# Patient Record
Sex: Male | Born: 2005 | Race: White | Hispanic: Yes | Marital: Single | Smoking: Never smoker
Health system: Southern US, Community
[De-identification: ages and names within clinical notes are randomized; demographics above are authoritative.]

## PROBLEM LIST (undated history)

## (undated) DIAGNOSIS — E559 Vitamin D deficiency, unspecified: Secondary | ICD-10-CM

## (undated) DIAGNOSIS — R4589 Other symptoms and signs involving emotional state: Secondary | ICD-10-CM

## (undated) DIAGNOSIS — Z789 Other specified health status: Secondary | ICD-10-CM

## (undated) DIAGNOSIS — F23 Brief psychotic disorder: Secondary | ICD-10-CM

---
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
## 2024-01-28 ENCOUNTER — Other Ambulatory Visit: Payer: Self-pay

## 2024-01-28 ENCOUNTER — Ambulatory Visit (HOSPITAL_COMMUNITY)
Admission: EM | Admit: 2024-01-28 | Discharge: 2024-01-28 | Disposition: A | Discharge status: Other Acute Inpatient Hospital | Attending: Psychiatry | Admitting: Psychiatry

## 2024-01-28 ENCOUNTER — Inpatient Hospital Stay (HOSPITAL_COMMUNITY)
Admission: AD | Admit: 2024-01-28 | Discharge: 2024-02-01 | DRG: 885 | Disposition: A | Discharge status: Home / Self Care | Source: Intra-hospital | Attending: Psychiatry | Admitting: Psychiatry

## 2024-01-28 ENCOUNTER — Encounter (HOSPITAL_COMMUNITY): Payer: Self-pay | Admitting: Behavioral Health

## 2024-01-28 DIAGNOSIS — F23 Brief psychotic disorder: Secondary | ICD-10-CM | POA: Diagnosis present

## 2024-01-28 DIAGNOSIS — R4689 Other symptoms and signs involving appearance and behavior: Secondary | ICD-10-CM | POA: Insufficient documentation

## 2024-01-28 DIAGNOSIS — R456 Violent behavior: Secondary | ICD-10-CM | POA: Diagnosis present

## 2024-01-28 DIAGNOSIS — R45851 Suicidal ideations: Secondary | ICD-10-CM | POA: Diagnosis present

## 2024-01-28 DIAGNOSIS — Z046 Encounter for general psychiatric examination, requested by authority: Secondary | ICD-10-CM

## 2024-01-28 DIAGNOSIS — Z79899 Other long term (current) drug therapy: Secondary | ICD-10-CM | POA: Diagnosis not present

## 2024-01-28 DIAGNOSIS — F329 Major depressive disorder, single episode, unspecified: Secondary | ICD-10-CM

## 2024-01-28 DIAGNOSIS — F10929 Alcohol use, unspecified with intoxication, unspecified: Secondary | ICD-10-CM | POA: Diagnosis present

## 2024-01-28 DIAGNOSIS — F10129 Alcohol abuse with intoxication, unspecified: Secondary | ICD-10-CM | POA: Diagnosis present

## 2024-01-28 DIAGNOSIS — R4589 Other symptoms and signs involving emotional state: Secondary | ICD-10-CM | POA: Diagnosis present

## 2024-01-28 DIAGNOSIS — F1020 Alcohol dependence, uncomplicated: Secondary | ICD-10-CM

## 2024-01-28 HISTORY — DX: Brief psychotic disorder: F23

## 2024-01-28 HISTORY — DX: Vitamin D deficiency, unspecified: E55.9

## 2024-01-28 HISTORY — DX: Other symptoms and signs involving emotional state: R45.89

## 2024-01-28 LAB — CBC WITH DIFFERENTIAL/PLATELET
Abs Immature Granulocytes: 0.04 10*3/uL (ref 0.00–0.07)
Basophils Absolute: 0 10*3/uL (ref 0.0–0.1)
Basophils Relative: 0 %
Eosinophils Absolute: 0 10*3/uL (ref 0.0–0.5)
Eosinophils Relative: 0 %
HCT: 45.8 % (ref 39.0–52.0)
Hemoglobin: 16.1 g/dL (ref 13.0–17.0)
Immature Granulocytes: 0 %
Lymphocytes Relative: 14 %
Lymphs Abs: 1.5 10*3/uL (ref 0.7–4.0)
MCH: 31.9 pg (ref 26.0–34.0)
MCHC: 35.2 g/dL (ref 30.0–36.0)
MCV: 90.7 fL (ref 80.0–100.0)
Monocytes Absolute: 0.4 10*3/uL (ref 0.1–1.0)
Monocytes Relative: 4 %
Neutro Abs: 9 10*3/uL — ABNORMAL HIGH (ref 1.7–7.7)
Neutrophils Relative %: 82 %
Platelets: 269 10*3/uL (ref 150–400)
RBC: 5.05 MIL/uL (ref 4.22–5.81)
RDW: 12.7 % (ref 11.5–15.5)
WBC: 11 10*3/uL — ABNORMAL HIGH (ref 4.0–10.5)
nRBC: 0 % (ref 0.0–0.2)

## 2024-01-28 LAB — POCT URINE DRUG SCREEN - MANUAL ENTRY (I-SCREEN)
POC Amphetamine UR: NOT DETECTED
POC Buprenorphine (BUP): NOT DETECTED
POC Cocaine UR: NOT DETECTED
POC Marijuana UR: NOT DETECTED
POC Methadone UR: NOT DETECTED
POC Methamphetamine UR: NOT DETECTED
POC Morphine: NOT DETECTED
POC Oxazepam (BZO): NOT DETECTED
POC Oxycodone UR: NOT DETECTED
POC Secobarbital (BAR): NOT DETECTED

## 2024-01-28 LAB — COMPREHENSIVE METABOLIC PANEL WITH GFR
ALT: 22 U/L (ref 0–44)
AST: 30 U/L (ref 15–41)
Albumin: 4.7 g/dL (ref 3.5–5.0)
Alkaline Phosphatase: 88 U/L (ref 38–126)
Anion gap: 8 (ref 5–15)
BUN: 15 mg/dL (ref 6–20)
CO2: 27 mmol/L (ref 22–32)
Calcium: 8.9 mg/dL (ref 8.9–10.3)
Chloride: 104 mmol/L (ref 98–111)
Creatinine, Ser: 0.84 mg/dL (ref 0.61–1.24)
GFR, Estimated: >60 mL/min (ref >60)
Glucose, Bld: 98 mg/dL (ref 70–99)
Potassium: 3.8 mmol/L (ref 3.5–5.1)
Sodium: 139 mmol/L (ref 135–145)
Total Bilirubin: 0.6 mg/dL (ref 0.0–1.2)
Total Protein: 8.1 g/dL (ref 6.5–8.1)

## 2024-01-28 LAB — TSH: TSH: 1.345 u[IU]/mL (ref 0.350–4.500)

## 2024-01-28 LAB — ETHANOL: Alcohol, Ethyl (B): 133 mg/dL — ABNORMAL HIGH (ref <10)

## 2024-01-28 MED ORDER — ALUM & MAG HYDROXIDE-SIMETH 200-200-20 MG/5ML PO SUSP: 30.0000 mL | ORAL | Status: DC | PRN

## 2024-01-28 MED ORDER — LORAZEPAM 2 MG/ML IJ SOLN: 2.0000 mg | Freq: Three times a day (TID) | INTRAMUSCULAR | Status: DC | PRN

## 2024-01-28 MED ORDER — OLANZAPINE 10 MG IM SOLR: 10.0000 mg | Freq: Three times a day (TID) | INTRAMUSCULAR | Status: DC | PRN

## 2024-01-28 MED ORDER — NICOTINE POLACRILEX 2 MG MT GUM: 2.0000 mg | CHEWING_GUM | OROMUCOSAL | Status: DC | PRN

## 2024-01-28 MED ORDER — ACETAMINOPHEN 325 MG PO TABS: 650.0000 mg | ORAL_TABLET | Freq: Four times a day (QID) | ORAL | Status: DC | PRN

## 2024-01-28 MED ORDER — HALOPERIDOL LACTATE 5 MG/ML IJ SOLN: 10.0000 mg | Freq: Three times a day (TID) | INTRAMUSCULAR | Status: DC | PRN

## 2024-01-28 MED ORDER — DIPHENHYDRAMINE HCL 25 MG PO CAPS: 50.0000 mg | ORAL_CAPSULE | Freq: Three times a day (TID) | ORAL | Status: DC | PRN

## 2024-01-28 MED ORDER — NICOTINE POLACRILEX 2 MG MT GUM
CHEWING_GUM | OROMUCOSAL | Status: AC
  Administered 2024-01-28: 2 mg via ORAL
  Filled 2024-01-28: qty 1

## 2024-01-28 MED ORDER — HALOPERIDOL 5 MG PO TABS: 5.0000 mg | ORAL_TABLET | Freq: Three times a day (TID) | ORAL | Status: DC | PRN

## 2024-01-28 MED ORDER — DIPHENHYDRAMINE HCL 50 MG/ML IJ SOLN: 50.0000 mg | Freq: Three times a day (TID) | INTRAMUSCULAR | Status: DC | PRN

## 2024-01-28 MED ORDER — TRAZODONE HCL 50 MG PO TABS
50.0000 mg | ORAL_TABLET | Freq: Every evening | ORAL | Status: DC | PRN
  Administered 2024-01-28 – 2024-01-31 (×4): 50 mg via ORAL
  Filled 2024-01-28 (×4): qty 1

## 2024-01-28 MED ORDER — MAGNESIUM HYDROXIDE 400 MG/5ML PO SUSP: 30.0000 mL | Freq: Every day | ORAL | Status: DC | PRN

## 2024-01-28 MED ORDER — OLANZAPINE 10 MG IM SOLR: 5.0000 mg | Freq: Three times a day (TID) | INTRAMUSCULAR | Status: DC | PRN

## 2024-01-28 MED ORDER — HYDROXYZINE HCL 25 MG PO TABS
25.0000 mg | ORAL_TABLET | Freq: Three times a day (TID) | ORAL | Status: DC | PRN
  Administered 2024-01-31: 25 mg via ORAL
  Filled 2024-01-28 (×2): qty 1

## 2024-01-28 MED ORDER — HALOPERIDOL LACTATE 5 MG/ML IJ SOLN: 5.0000 mg | Freq: Three times a day (TID) | INTRAMUSCULAR | Status: DC | PRN

## 2024-01-28 MED ORDER — OLANZAPINE 5 MG PO TBDP: 5.0000 mg | ORAL_TABLET | Freq: Three times a day (TID) | ORAL | Status: DC | PRN
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
## 2024-01-28 NOTE — ED Notes (Signed)
Pt sleeping at this time   Rise and fall of chest noted
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
## 2024-01-28 NOTE — ED Notes (Signed)
 Patient discharged to [facility]. Pt's belongings returned from locker #1. Patient escorted by staff to the back [place] port for transport to [facility] via [employer].
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
## 2024-01-28 NOTE — Tx Team (Signed)
 Initial Treatment Plan 01/28/2024 6:22 PM [name] [id number]    PATIENT STRESSORS: Financial difficulties   Legal issue   Marital or family conflict   Occupational concerns     PATIENT STRENGTHS: Capable of independent living  Communication skills  Physical Health  Supportive family/friends  Work skills    PATIENT IDENTIFIED PROBLEMS: Risk for self harm (Pt was loading a gun prior to d/c)    Alterations in mood (Anger /agitation, mood lability) Pt aggressive towards family members    Substance Use "I don't drink all the time". Pt etoh was 133 on admission.              DISCHARGE CRITERIA:  Improved stabilization in mood, thinking, and/or behavior Verbal commitment to aftercare and medication compliance Withdrawal symptoms are absent or subacute and managed without 24-hour nursing intervention  PRELIMINARY DISCHARGE PLAN: Outpatient therapy Return to previous living arrangement Return to previous work or school arrangements  PATIENT/FAMILY INVOLVEMENT: This treatment plan has been presented to and reviewed with the patient, Cory Wright. The patient have been given the opportunity to ask questions and make suggestions.  Sherryl Manges, RN 01/28/2024, 6:22 PM

## 2024-01-28 NOTE — Progress Notes (Signed)
 Pt has been accepted to [facility] on 01/28/2024 Bed assignment: 500-1 AFTER 4 PM   Pt meets inpatient criteria per: Liborio Nixon NP  Attending Physician will be: Dr. Sarita Bottom, MD   Report can be called to: Adult unit: [phone]  Pt can arrive after 4 PM TODAY   Care Team Notified: [facility] RN, Fonnie Jarvis RN, Denton Ar RN, Liborio Nixon NP  [place] Milton Streicher LCSW-A   01/28/2024 1:23 PM
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
## 2024-01-28 NOTE — ED Notes (Signed)
 Patient alert at this time. Patient admitted to Obs under IVC. Patient reluctant to talk about why he was admitted to [facility]. IVC papers reports patient barricaded himself in a room with a gun. Unable to determine if patient has HI or AVH. However patient does not appear to be responding to external or internal stimuli. Patient oriented to unit, meal and beverage given. Patient denies any physical complaints when asked. No acute distress noted. Support and encouragement provided. Routine safety checks conducted according to facility protocol. Encouraged patient to notify staff if thoughts of harm toward self or others arise. Patient verbalize understanding and agreement. Will continue to monitor for safety.

## 2024-01-28 NOTE — Discharge Instructions (Signed)
 Patient accepted to [facility]

## 2024-01-28 NOTE — Plan of Care (Signed)
  Problem: Safety: Goal: Periods of time without injury will increase Outcome: Progressing   Problem: Self-Concept: Goal: Ability to identify factors that promote anxiety will improve Outcome: Progressing Goal: Ability to modify response to factors that promote anxiety will improve Outcome: Progressing

## 2024-01-28 NOTE — Plan of Care (Signed)
  Problem: Safety: Goal: Periods of time without injury will increase 01/28/2024 1923 by Sherryl Manges, RN Outcome: Progressing  Problem: Self-Concept: Goal: Ability to identify factors that promote anxiety will improve 01/28/2024 1923 by Sherryl Manges, RN Outcome: Progressing Goal: Ability to modify response to factors that promote anxiety will improve 01/28/2024 1923 by Sherryl Manges, RN Outcome: Progressing

## 2024-01-28 NOTE — ED Notes (Addendum)
 Report given to Alexa,RN

## 2024-01-28 NOTE — ED Notes (Signed)
 Lunch offered but pt declined.

## 2024-01-28 NOTE — ED Notes (Signed)
 Pt A&Ox4. Denies SI/HI, AVH. Pt calm and cooperative at this time. Will continue to monitor pt.

## 2024-01-28 NOTE — ED Notes (Signed)
 Call placed to give report, no answer. Will try again.
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
## 2024-01-29 DIAGNOSIS — F23 Brief psychotic disorder: Secondary | ICD-10-CM | POA: Diagnosis present

## 2024-01-29 DIAGNOSIS — R4589 Other symptoms and signs involving emotional state: Secondary | ICD-10-CM | POA: Diagnosis present

## 2024-01-29 DIAGNOSIS — R456 Violent behavior: Secondary | ICD-10-CM | POA: Diagnosis present

## 2024-01-29 DIAGNOSIS — F10929 Alcohol use, unspecified with intoxication, unspecified: Secondary | ICD-10-CM | POA: Diagnosis present

## 2024-01-29 LAB — LIPID PANEL
Cholesterol: 168 mg/dL (ref 0–200)
HDL: 73 mg/dL (ref >40)
LDL Cholesterol: 68 mg/dL (ref 0–99)
Total CHOL/HDL Ratio: 2.3 ratio
Triglycerides: 136 mg/dL (ref <150)
VLDL: 27 mg/dL (ref 0–40)

## 2024-01-29 LAB — HEMOGLOBIN A1C
Hgb A1c MFr Bld: 4.9 % (ref 4.8–5.6)
Mean Plasma Glucose: 93.93 mg/dL

## 2024-01-29 LAB — TSH: TSH: 2.775 u[IU]/mL (ref 0.350–4.500)

## 2024-01-29 LAB — VITAMIN D 25 HYDROXY (VIT D DEFICIENCY, FRACTURES): Vit D, 25-Hydroxy: 8.48 ng/mL — ABNORMAL LOW (ref 30–100)

## 2024-01-29 LAB — FOLATE: Folate: 10.2 ng/mL (ref >5.9)

## 2024-01-29 LAB — VITAMIN B12: Vitamin B-12: 341 pg/mL (ref 180–914)

## 2024-01-29 MED ORDER — NICOTINE 21 MG/24HR TD PT24
21.0000 mg | MEDICATED_PATCH | Freq: Every day | TRANSDERMAL | Status: DC
  Administered 2024-01-29 – 2024-02-01 (×3): 21 mg via TRANSDERMAL
  Filled 2024-01-29 (×7): qty 1

## 2024-01-29 MED ORDER — RISPERIDONE 1 MG PO TBDP
1.0000 mg | ORAL_TABLET | Freq: Two times a day (BID) | ORAL | Status: DC
  Administered 2024-01-29 – 2024-01-30 (×2): 1 mg via ORAL
  Filled 2024-01-29 (×6): qty 1

## 2024-01-29 NOTE — Plan of Care (Signed)
   Problem: Education: Goal: Emotional status will improve Outcome: Not Progressing Goal: Mental status will improve Outcome: Not Progressing
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
## 2024-01-29 NOTE — Group Note (Signed)
 Date:  01/29/2024 Time:  8:30 PM  Group Topic/Focus:  Wrap-Up Group:   The focus of this group is to help patients review their daily goal of treatment and discuss progress on daily workbooks.    Participation Level:  Active  Lita Mains [facility] 01/29/2024, 8:30 PM

## 2024-01-29 NOTE — B[facility] Counselor (Signed)
 Adult Comprehensive Assessment  Patient ID: Cory Wright, male   DOB: 26-May-2006, 18 y.o.   MRN: [id number]  Information Source: Information source: Patient  Current Stressors:  Patient states their primary concerns and needs for treatment are:: patient share that the [employer] brought him here for SI Patient states their goals for this hospitilization and ongoing recovery are:: Patient share that he does not know Educational / Learning stressors: none reported Employment / Job issues: none reported Family Relationships: none reported Surveyor, quantity / Lack of resources (include bankruptcy): not really Housing / Lack of housing: none reported Physical health (include injuries & life threatening diseases): none reported Social relationships: none reported Substance abuse: none reported Bereavement / Loss: none reported  Living/Environment/Situation:  Living Arrangements: Other relatives Living conditions (as described by patient or guardian): Patient share that its good Who else lives in the home?: sister and patient How long has patient lived in current situation?: a couple of months What is atmosphere in current home: Comfortable, Supportive, Loving  Family History:  Marital status: Single Are you sexually active?: Yes What is your sexual orientation?: heterosexual Has your sexual activity been affected by drugs, alcohol, medication, or emotional stress?: none reported Does patient have children?: No  Childhood History:  By whom was/is the patient raised?: Mother Additional childhood history information: patient share they separated a long time ago Description of patient's relationship with caregiver when they were a child: Patient share that it was good Patient's description of current relationship with people who raised him/her: Patient share that it is still good How were you disciplined when you got in trouble as a child/adolescent?: Patient share that he grounded, and  things get taken away Does patient have siblings?: Yes Number of Siblings: 4 Description of patient's current relationship with siblings: Patient share that it is good Did patient suffer any verbal/emotional/physical/sexual abuse as a child?: No Did patient suffer from severe childhood neglect?: No Witnessed domestic violence?: No  Education:  Highest grade of school patient has completed: 12 grade Currently a student?: No Learning disability?: No  Employment/Work Situation:   Employment Situation: Employed Where is Patient Currently Employed?: 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?: No  Financial Resources:   Financial resources: Income from employment, Medicaid Does patient have a representative payee or guardian?: No  Alcohol/Substance Abuse:   What has been your use of drugs/alcohol within the last 12 months?: yes alcohol If attempted suicide, did drugs/alcohol play a role in this?: No Alcohol/Substance Abuse Treatment Hx: Denies past history Has alcohol/substance abuse ever caused legal problems?: No  Social Support System:   Conservation officer, nature Support System: Fair Museum/gallery exhibitions officer System: Patient share that he has his family and friends Type of faith/religion: Patient share that he was catholic How does patient's faith help to cope with current illness?: Patient share that he has faith  Leisure/Recreation:   Do You Have Hobbies?: Yes Leisure and Hobbies: Patient share he plays gamies  Strengths/Needs:   What is the patient's perception of their strengths?: Patient share that he is a good listener Patient states they can use these personal strengths during their treatment to  contribute to their recovery: Patient share that listening will help understand  the lesson Patient states these barriers may affect/interfere with their treatment: Patient does not know Patient states these barriers may affect their return to the community: Patient share that will affect when he knows more about what had happen  Discharge Plan:   Currently receiving community mental health services: No Patient states concerns and preferences for aftercare planning are: Patient share that he does not care for out patient therapy Patient states they will know when they are safe and ready for discharge when: Patient share that he does not know Does patient have access to transportation?: Yes Does patient have financial barriers related to discharge medications?: No Patient description of barriers related to discharge medications: none reported  Summary/Recommendations:   Summary and Recommendations (to be completed by the evaluator): Cory Wright is a 18 year old Hispanic American Male. The patient is alert and oriented X4. The patient was IVC to [facility] for a brief psychotic episode. At the current time the client denies SI, HI and AVH. The patient has 4 sibling and is currently staying with his older sister. The client sister reported that he locked himself in the bathroom and threaten to kill himself. The client share that he does not know why he was put in [facility]. The client stated that his sister put him here to teach him a lesson. The patient has no history of psychiatric disorder or has seek mental health. The patient share that he will never do it again. 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 The patient share that he needs to be discharge because he has to work. Patient will benefit from crisis stabilization, medication evaluation, group therapy and psychoeducation, in addition to case management for discharge planning. At discharge it is recommended that Patient  adhere to the established discharge plan and continue in treatment.  Cory Wright. 01/29/2024
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
## 2024-01-30 ENCOUNTER — Encounter (HOSPITAL_COMMUNITY): Payer: Self-pay | Admitting: Behavioral Health

## 2024-01-30 DIAGNOSIS — F23 Brief psychotic disorder: Principal | ICD-10-CM

## 2024-01-30 LAB — RPR: RPR Ser Ql: NONREACTIVE

## 2024-01-30 MED ORDER — RISPERIDONE 2 MG PO TBDP
2.0000 mg | ORAL_TABLET | Freq: Two times a day (BID) | ORAL | Status: DC
  Filled 2024-01-30 (×2): qty 1

## 2024-01-30 MED ORDER — RISPERIDONE 1 MG PO TBDP: 1.0000 mg | ORAL_TABLET | Freq: Every day | ORAL | Status: DC

## 2024-01-30 MED ORDER — VITAMIN D3 25 MCG PO TABS
2000.0000 [IU] | ORAL_TABLET | Freq: Two times a day (BID) | ORAL | Status: DC
  Administered 2024-01-31 – 2024-02-01 (×3): 2000 [IU] via ORAL
  Filled 2024-01-30 (×5): qty 2

## 2024-01-30 MED ORDER — RISPERIDONE 1 MG PO TBDP
1.0000 mg | ORAL_TABLET | Freq: Two times a day (BID) | ORAL | Status: DC
  Administered 2024-01-30 – 2024-01-31 (×2): 1 mg via ORAL
  Filled 2024-01-30 (×7): qty 1

## 2024-01-30 MED ORDER — VITAMIN D (ERGOCALCIFEROL) 1.25 MG (50000 UNIT) PO CAPS
50000.0000 [IU] | ORAL_CAPSULE | ORAL | Status: DC
  Administered 2024-01-30: 50000 [IU] via ORAL
  Filled 2024-01-30 (×2): qty 1

## 2024-01-30 NOTE — [facility] Group Notes (Signed)
 [facility] Group Notes:  (Nursing/MHT/Case Management/Adjunct)  Date:  01/30/2024  Time:  2000  Type of Therapy:   Wrap up group  Participation Level:  Active  Participation Quality:  Appropriate, Attentive, Sharing, and Supportive  Affect:  Appropriate  Cognitive:  Alert  Insight:  Improving  Engagement in Group:  Engaged  Modes of Intervention:  Clarification, Education, and Socialization  Summary of Progress/Problems: Positive thinking and self-care were discussed.   Marcille Buffy 01/30/2024, 9:19 PM

## 2024-01-30 NOTE — Progress Notes (Signed)
   01/29/24 2006  Psych Admission Type (Psych Patients Only)  Admission Status Involuntary  Psychosocial Assessment  Patient Complaints None  Eye Contact Fair  Facial Expression Anxious  Affect Anxious  Speech Logical/coherent  Interaction Forwards little  Motor Activity Restless  Appearance/Hygiene Unremarkable  Behavior Characteristics Appropriate to situation  Mood Anxious  Aggressive Behavior  Effect No apparent injury  Thought Process  Coherency WDL  Delusions None reported or observed  Perception WDL  Hallucination None reported or observed  Judgment WDL  Confusion WDL  Danger to Self  Current suicidal ideation? Denies  Self-Injurious Behavior No self-injurious ideation or behavior indicators observed or expressed   Description of Agreement verbal  Danger to Others  Danger to Others None reported or observed

## 2024-01-30 NOTE — Progress Notes (Signed)
   01/30/24 0800  Psych Admission Type (Psych Patients Only)  Admission Status Involuntary  Psychosocial Assessment  Patient Complaints None  Eye Contact Fair  Facial Expression Anxious  Affect Anxious  Speech Logical/coherent  Interaction Forwards little  Motor Activity Other (Comment) (WNL)  Appearance/Hygiene Unremarkable  Behavior Characteristics Cooperative;Calm  Mood Anxious  Thought Process  Coherency WDL  Content WDL  Delusions None reported or observed  Perception WDL  Hallucination None reported or observed  Judgment WDL  Confusion WDL  Danger to Self  Current suicidal ideation? Denies  Self-Injurious Behavior No self-injurious ideation or behavior indicators observed or expressed   Agreement Not to Harm Self Yes  Description of Agreement verbal  Danger to Others  Danger to Others None reported or observed

## 2024-01-30 NOTE — Plan of Care (Signed)
  Problem: Education: Goal: Emotional status will improve Outcome: Progressing Goal: Verbalization of understanding the information provided will improve Outcome: Progressing   Problem: Activity: Goal: Interest or engagement in activities will improve Outcome: Progressing Goal: Sleeping patterns will improve Outcome: Progressing   Problem: Education: Goal: Knowledge of [place] [facility] Education information/materials will improve Outcome: Progressing   Problem: Health Behavior/Discharge Planning: Goal: Compliance with treatment plan for underlying cause of condition will improve Outcome: Progressing

## 2024-01-30 NOTE — [facility] Suicide Risk Assessment (Signed)
 [facility] INPATIENT:  Family/Significant Other Suicide Prevention Education  Suicide Prevention Education:  Education Completed; Verlon Au Girlfriend [phone]),  has been identified by the patient as the family member/significant other with whom the patient will be residing, and identified as the person(s) who will aid the patient in the event of a mental health crisis (suicidal ideations/suicide attempt).  With written consent from the patient, the family member/significant other has been provided the following suicide prevention education, prior to the and/or following the discharge of the patient.  The suicide prevention education provided includes the following: Suicide risk factors Suicide prevention and interventions National Suicide Hotline telephone number [facility] assessment telephone number [facility] Emergency Assistance 911 [facility] and/or Residential Mobile Crisis Unit telephone number  Request made of family/significant other to: Remove weapons (e.g., guns, rifles, knives), all items previously/currently identified as safety concern.   Remove drugs/medications (over-the-counter, prescriptions, illicit drugs), all items previously/currently identified as a safety concern.  The family member/significant other verbalizes understanding of the suicide prevention education information provided.  The family member/significant other agrees to remove the items of safety concern listed above.  Ira Busbin O Elice Crigger 01/30/2024, 2:29 PM
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
## 2024-01-30 NOTE — Progress Notes (Signed)
   01/30/24 2152  Psych Admission Type (Psych Patients Only)  Admission Status Involuntary  Psychosocial Assessment  Patient Complaints None  Eye Contact Fair  Facial Expression Anxious  Affect Appropriate to circumstance  Speech Logical/coherent  Interaction Guarded;Forwards little  Motor Activity Other (Comment) (WDL)  Appearance/Hygiene Unremarkable  Behavior Characteristics Appropriate to situation  Mood Anxious;Pleasant  Thought Process  Coherency WDL  Content WDL  Delusions None reported or observed  Perception WDL  Hallucination None reported or observed  Judgment WDL  Confusion None  Danger to Self  Current suicidal ideation? Denies  Self-Injurious Behavior No self-injurious ideation or behavior indicators observed or expressed   Agreement Not to Harm Self Yes  Description of Agreement verbal  Danger to Others  Danger to Others None reported or observed

## 2024-01-31 ENCOUNTER — Encounter (HOSPITAL_COMMUNITY): Payer: Self-pay

## 2024-01-31 DIAGNOSIS — F23 Brief psychotic disorder: Secondary | ICD-10-CM | POA: Diagnosis not present

## 2024-01-31 MED ORDER — RISPERIDONE 0.25 MG PO TABS
0.2500 mg | ORAL_TABLET | Freq: Every day | ORAL | Status: DC
  Administered 2024-01-31: 0.25 mg via ORAL
  Filled 2024-01-31 (×3): qty 1

## 2024-01-31 NOTE — Plan of Care (Signed)
   Problem: Education: Goal: Mental status will improve Outcome: Progressing   Problem: Education: Goal: Verbalization of understanding the information provided will improve Outcome: Progressing

## 2024-01-31 NOTE — Group Note (Signed)
 Recreation Therapy Group Note   Group Topic:Problem Solving  Group Date: 01/31/2024 Start Time: 0935 End Time: 1000 Facilitators: Altie Savard-McCall, LRT,CTRS Location: [address] Dayroom   Group Topic: Communication, Team Building, Problem Solving   Goal Area(s) Addresses:  Patient will effectively work with peer towards shared goal.  Patient will identify skills used to make activity successful.  Patient will identify how skills used during activity can be used to reach post d/c goals.    Intervention: STEM Activity   Activity: Straw Bridge. In teams of 3-5, patients were given 15 plastic drinking straws and an equal length of masking tape. Using the materials provided, patients were instructed to build a free standing bridge-like structure to suspend an everyday item (ex: puzzle box) off of the floor or table surface. All materials were required to be used by the team in their design. LRT facilitated post-activity discussion reviewing team process. Patients were encouraged to reflect how the skills used in this activity can be generalized to daily life post discharge.    Education: Pharmacist, community, Scientist, physiological, Discharge Planning    Education Outcome: Acknowledges education/In group clarification offered/Needs additional education.   Affect/Mood: Appropriate   Participation Level: Engaged   Participation Quality: Independent   Behavior: Appropriate   Speech/Thought Process: Focused   Insight: Good   Judgement: Good   Modes of Intervention: STEM Activity   Patient Response to Interventions:  Engaged   Education Outcome:  In group clarification offered    Clinical Observations/Individualized Feedback: Pt was quiet but attentive and engaged. Pt worked well with peer in completing the straw bridge to successfully hold the puzzle box.      Plan: Continue to engage patient in RT group sessions 2-3x/week.   Maurya Nethery-McCall, LRT,CTRS 01/31/2024 11:57 AM
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
## 2024-01-31 NOTE — Group Note (Signed)
 Date:  01/31/2024 Time:  6:14 PM  Group Topic/Focus:  Building Self Esteem:   The Focus of this group is helping patients become aware of the effects of self-esteem on their lives, the things they and others do that enhance or undermine their self-esteem, seeing the relationship between their level of self-esteem and the choices they make and learning ways to enhance self-esteem. Emotional Education:   The focus of this group is to discuss what feelings/emotions are, and how they are experienced.    Participation Level:  Active  Participation Quality:  Appropriate  Affect:  Appropriate  Cognitive:  Appropriate  Insight: Appropriate  Engagement in Group:  Engaged  Modes of Intervention:  Activity  Additional Comments:   Pt attended and actively participated in the Emotional Wellness group.  Edmund Hilda Ephraim Reichel 01/31/2024, 6:14 PM

## 2024-01-31 NOTE — Group Note (Signed)
 Occupational Therapy Group Note  Group Topic: Sleep Hygiene  Group Date: 01/31/2024 Start Time: 1430 End Time: 1500 Facilitators: Ted Mcalpine, OT   Group Description: Group encouraged increased participation and engagement through topic focused on sleep hygiene. Patients reflected on the quality of sleep they typically receive and identified areas that need improvement. Group was given background information on sleep and sleep hygiene, including common sleep disorders. Group members also received information on how to improve one's sleep and introduced a sleep diary as a tool that can be utilized to track sleep quality over a length of time. Group session ended with patients identifying one or more strategies they could utilize or implement into their sleep routine in order to improve overall sleep quality.        Therapeutic Goal(s):  Identify one or more strategies to improve overall sleep hygiene  Identify one or more areas of sleep that are negatively impacted (sleep too much, too little, etc)     Participation Level: Engaged   Participation Quality: Independent   Behavior: Appropriate   Speech/Thought Process: Relevant   Affect/Mood: Appropriate   Insight: Fair   Judgement: Fair      Modes of Intervention: Education  Patient Response to Interventions:  Attentive   Plan: Continue to engage patient in OT groups 2 - 3x/week.  01/31/2024  Ted Mcalpine, OT  Kerrin Champagne, OT

## 2024-01-31 NOTE — Group Note (Signed)
 Date:  01/31/2024 Time:  9:33 PM  Group Topic/Focus:  Alcoholics Anonymous (AA) Meeting    Participation Level:  Active  Participation Quality:  Appropriate  Affect:  Appropriate  Cognitive:  Appropriate  Insight: Appropriate  Engagement in Group:  Engaged  Modes of Intervention:  Discussion, Socialization, and Support  Additional Comments:  Patient attended AA  Cory Wright 01/31/2024, 9:33 PM

## 2024-01-31 NOTE — Group Note (Signed)
 Date:  01/31/2024 Time:  4:30 PM  Group Topic/Focus:  Goals Group:   The focus of this group is to help patients establish daily goals to achieve during treatment and discuss how the patient can incorporate goal setting into their daily lives to aide in recovery. Orientation:   The focus of this group is to educate the patient on the purpose and policies of crisis stabilization and provide a format to answer questions about their admission.  The group details unit policies and expectations of patients while admitted.    Participation Level:  Active  Participation Quality:  Appropriate and Attentive  Affect:  Appropriate  Cognitive:  Appropriate  Insight: Appropriate  Engagement in Group:  Engaged  Modes of Intervention:  Discussion and Orientation  Additional Comments:   Pt actively participated in the Orientation and Goals group.   Edmund Hilda Adynn Caseres 01/31/2024, 4:30 PM

## 2024-01-31 NOTE — Progress Notes (Signed)
   01/31/24 0531  15 Minute Checks  Location Bedroom  Visual Appearance Calm  Behavior Composed  Sleep (Behavioral Health Patients Only)  Calculate sleep? (Click Yes once per 24 hr at 0600 safety check) Yes  Documented sleep last 24 hours 8

## 2024-01-31 NOTE — [facility] Group Notes (Signed)
 Topic: Spiritual care group on grief and loss facilitated by Chaplain Claudell Kyle, MDiv   Group Goal: Support / Education around grief and loss  Members engage in facilitated group support and psycho-social education.  Group Description: Following introductions and group rules, group members engaged in facilitated group dialog and support around topic of loss, with particular support around experiences of loss in their lives. Group Identified types of loss (relationships / self / things) and identified patterns, circumstances, and changes that precipitate losses. Reflected on thoughts / feelings around loss, normalized grief responses, and recognized variety in grief experience. Group noted Worden's four tasks of grief in discussion.  Group drew on Adlerian / Rogerian, narrative, MI,  Patient Progress: Cory Wright participated in the full group time. Participation was appropriate though mostly passive participation.  [name] L. [name], M.Div [phone]

## 2024-01-31 NOTE — BH IP Treatment Plan (Signed)
 Interdisciplinary Treatment and Diagnostic Plan Update  01/31/2024 Time of Session: 11:20AM Luman Holway MRN: [id number]  Principal Diagnosis: Brief psychotic disorder [facility])  Secondary Diagnoses: Principal Problem:   Brief psychotic disorder (HCC) Active Problems:   Suicidal ideations   Alcohol intoxication (HCC)   Suicidal behavior   Violent behavior   Current Medications:  Current Facility-Administered Medications  Medication Dose Route Frequency Provider Last Rate Last Admin   acetaminophen (TYLENOL) tablet 650 mg  650 mg Oral Q6H PRN White, Patrice L, NP       alum & mag hydroxide-simeth (MAALOX/MYLANTA) 200-200-20 MG/5ML suspension 30 mL  30 mL Oral Q4H PRN White, Patrice L, NP       haloperidol (HALDOL) tablet 5 mg  5 mg Oral TID PRN White, Patrice L, NP       And   diphenhydrAMINE (BENADRYL) capsule 50 mg  50 mg Oral TID PRN White, Patrice L, NP       haloperidol lactate (HALDOL) injection 5 mg  5 mg Intramuscular TID PRN White, Patrice L, NP       And   diphenhydrAMINE (BENADRYL) injection 50 mg  50 mg Intramuscular TID PRN White, Patrice L, NP       And   LORazepam (ATIVAN) injection 2 mg  2 mg Intramuscular TID PRN White, Patrice L, NP       haloperidol lactate (HALDOL) injection 10 mg  10 mg Intramuscular TID PRN White, Patrice L, NP       And   diphenhydrAMINE (BENADRYL) injection 50 mg  50 mg Intramuscular TID PRN White, Patrice L, NP       And   LORazepam (ATIVAN) injection 2 mg  2 mg Intramuscular TID PRN White, Patrice L, NP       hydrOXYzine (ATARAX) tablet 25 mg  25 mg Oral TID PRN White, Patrice L, NP       magnesium hydroxide (MILK OF MAGNESIA) suspension 30 mL  30 mL Oral Daily PRN White, Patrice L, NP       nicotine (NICODERM CQ - dosed in mg/24 hours) patch 21 mg  21 mg Transdermal Daily Attiah, Nadir, MD   21 mg at 01/31/24 8295   nicotine polacrilex (NICORETTE) gum 2 mg  2 mg Oral PRN Bobbitt, Shalon E, NP   2 mg at 01/28/24 2121    risperiDONE (RISPERDAL) tablet 0.25 mg  0.25 mg Oral QHS Izediuno, Vincent A, MD       traZODone (DESYREL) tablet 50 mg  50 mg Oral QHS PRN White, Patrice L, NP   50 mg at 01/30/24 2104   Vitamin D (Ergocalciferol) (DRISDOL) 1.25 MG (50000 UNIT) capsule 50,000 Units  50,000 Units Oral Q7 days Golda Acre, MD   50,000 Units at 01/30/24 1226   vitamin D3 (CHOLECALCIFEROL) tablet 2,000 Units  2,000 Units Oral BID Golda Acre, MD   2,000 Units at 01/31/24 0805   PTA Medications: No medications prior to admission.    Patient Stressors: Neurosurgeon issue   Marital or family conflict   Occupational concerns    Patient Strengths: Capable of independent living  Communication skills  Physical Health  Supportive family/friends  Work skills   Treatment Modalities: Medication Management, Group therapy, Case management,  1 to 1 session with clinician, Psychoeducation, Recreational therapy.   Physician Treatment Plan for Primary Diagnosis: Brief psychotic disorder (HCC) Long Term Goal(s):     Short Term Goals:    Medication Management: Evaluate patient's response,  side effects, and tolerance of medication regimen.  Therapeutic Interventions: 1 to 1 sessions, Unit Group sessions and Medication administration.  Evaluation of Outcomes: Not Progressing  Physician Treatment Plan for Secondary Diagnosis: Principal Problem:   Brief psychotic disorder (HCC) Active Problems:   Suicidal ideations   Alcohol intoxication (HCC)   Suicidal behavior   Violent behavior  Long Term Goal(s):     Short Term Goals:       Medication Management: Evaluate patient's response, side effects, and tolerance of medication regimen.  Therapeutic Interventions: 1 to 1 sessions, Unit Group sessions and Medication administration.  Evaluation of Outcomes: Not Progressing   RN Treatment Plan for Primary Diagnosis: Brief psychotic disorder (HCC) Long Term Goal(s): Knowledge of disease  and therapeutic regimen to maintain health will improve  Short Term Goals: Ability to verbalize frustration and anger appropriately will improve, Ability to demonstrate self-control, Ability to participate in decision making will improve, Ability to disclose and discuss suicidal ideas, and Compliance with prescribed medications will improve  Medication Management: RN will administer medications as ordered by provider, will assess and evaluate patient's response and provide education to patient for prescribed medication. RN will report any adverse and/or side effects to prescribing provider.  Therapeutic Interventions: 1 on 1 counseling sessions, Psychoeducation, Medication administration, Evaluate responses to treatment, Monitor vital signs and CBGs as ordered, Perform/monitor CIWA, COWS, AIMS and Fall Risk screenings as ordered, Perform wound care treatments as ordered.  Evaluation of Outcomes: Not Progressing   LCSW Treatment Plan for Primary Diagnosis: Brief psychotic disorder [facility]) Long Term Goal(s): Safe transition to appropriate next level of care at discharge, Engage patient in therapeutic group addressing interpersonal concerns.  Short Term Goals: Engage patient in aftercare planning with referrals and resources, Increase ability to appropriately verbalize feelings, Increase emotional regulation, Identify triggers associated with mental health/substance abuse issues, and Increase skills for wellness and recovery  Therapeutic Interventions: Assess for all discharge needs, 1 to 1 time with Social worker, Explore available resources and support systems, Assess for adequacy in community support network, Educate family and significant other(s) on suicide prevention, Complete Psychosocial Assessment, Interpersonal group therapy.  Evaluation of Outcomes: Not Progressing   Progress in Treatment: Attending groups: Yes. Participating in groups: Yes. Taking medication as prescribed:  Yes. Toleration medication: Yes. Family/Significant other contact made: Yes, individual(s) contacted:  Pt's girlfriend, however Pt request that CSW contact Pt's mother Suzette Battiest [phone]) Patient understands diagnosis: Yes. Discussing patient identified problems/goals with staff: Yes. Medical problems stabilized or resolved: Yes. Denies suicidal/homicidal ideation: Yes. Issues/concerns per patient self-inventory: No. Other: N/a  New problem(s) identified: No, Describe:  None  New Short Term/Long Term Goal(s): detox, medication management for mood stabilization; elimination of SI thoughts; development of comprehensive mental wellness/sobriety plan  Patient Goals:  "To get better"  Discharge Plan or Barriers: Patient recently admitted. CSW will continue to follow and assess for appropriate referrals and possible discharge planning.   Reason for Continuation of Hospitalization: Medication stabilization Withdrawal symptoms Other; describe Mood stabilization, discharge planning  Estimated Length of Stay: 3-5 DAYS  Last 3 [place] Suicide Severity Risk Score: Flowsheet Row [facility] Most recent reading at 01/28/2024  5:33 PM ED from 01/28/2024 in [facility] Most recent reading at 01/28/2024  4:45 AM ED from 06/06/2022 in [facility] at [facility] Most recent reading at 06/06/2022  2:31 AM  C-SSRS RISK CATEGORY High Risk High Risk No Risk  Last PHQ 2/9 Scores:    01/28/2024    4:35 AM  Depression screen PHQ 2/9  Decreased Interest 0  Down, Depressed, Hopeless 1  PHQ - 2 Score 1  Altered sleeping 0  Tired, decreased energy 0  Change in appetite 0  Feeling bad or failure about yourself  1  Trouble concentrating 0  Moving slowly or fidgety/restless 1  Suicidal thoughts 1  PHQ-9 Score 4  Difficult doing work/chores Somewhat difficult    Scribe  for Treatment Team: Jacinta Shoe, LCSW 01/31/2024 3:25 PM

## 2024-01-31 NOTE — Progress Notes (Signed)
   01/31/24 0805  Psych Admission Type (Psych Patients Only)  Admission Status Involuntary  Psychosocial Assessment  Patient Complaints None  Eye Contact Fair  Facial Expression Animated  Affect Appropriate to circumstance  Speech Logical/coherent  Interaction Guarded  Motor Activity Other (Comment) (wdl)  Appearance/Hygiene Unremarkable  Behavior Characteristics Appropriate to situation  Mood Pleasant  Thought Process  Coherency WDL  Content WDL  Delusions None reported or observed  Perception WDL  Hallucination None reported or observed  Judgment WDL  Confusion None  Danger to Self  Current suicidal ideation? Denies  Agreement Not to Harm Self Yes  Description of Agreement verbal  Danger to Others  Danger to Others None reported or observed

## 2024-02-01 DIAGNOSIS — F23 Brief psychotic disorder: Secondary | ICD-10-CM | POA: Diagnosis not present

## 2024-02-01 LAB — VITAMIN B1: Vitamin B1 (Thiamine): 128.7 nmol/L (ref 66.5–200.0)

## 2024-02-01 MED ORDER — NICOTINE 21 MG/24HR TD PT24: 21.0000 mg | MEDICATED_PATCH | Freq: Every day | TRANSDERMAL | 0 refills | Status: AC

## 2024-02-01 MED ORDER — RISPERIDONE 0.25 MG PO TABS: 0.2500 mg | ORAL_TABLET | Freq: Every day | ORAL | 0 refills | Status: AC

## 2024-02-01 NOTE — Discharge Summary (Signed)
 Physician Discharge Summary Note  Patient:  Cory Wright is an 18 y.o., male MRN:  [id number] DOB:  01-26-2006 Patient phone:  [phone] (home)  Patient address:   [address] [place] [facility] [address],  Total Time spent with patient: 45 minutes  Date of Admission:  01/28/2024 Date of Discharge: 02/01/2024  Reason for Admission:   18 year old Caucasian male, lives with his family, employed.  History of binge use of alcohol.  Presented involuntarily following a rageful episode at home.  Reported to have no violent behavior with his mother's car.  Reported to have withdrawn on his mornings account giving his girlfriend instruction on what to do with the money.  Reported to have barricaded himself at home threatening to shoot himself with a loaded gun.  The police helped de-escalate the situation got him into the hospital. BAL at presentation as 133 mg/dl.  UDS was negative.  Principal Problem: Brief psychotic disorder [facility]) Discharge Diagnoses: Principal Problem:   Brief psychotic disorder (HCC) Active Problems:   Suicidal ideations   Alcohol intoxication (HCC)   Suicidal behavior   Violent behavior   Past Psychiatric History:  No past history of mental illness.  Past Medical History:  Past Medical History:  Diagnosis Date   Brief psychotic disorder (HCC)    Suicidal behavior    Vitamin D deficiency    History reviewed. No pertinent surgical history. Family History: History reviewed. No pertinent family history. Family Psychiatric  History:  No family history of mental illness.  Social History:  Social History   Substance and Sexual Activity  Alcohol Use Yes     Social History   Substance and Sexual Activity  Drug Use Never    Social History   Socioeconomic History   Marital status: Single    Spouse name: Not on file   Number of children: Not on file   Years of education: Not on file   Highest education level: Not on file  Occupational  History   Not on file  Tobacco Use   Smoking status: Never   Smokeless tobacco: Never  Substance and Sexual Activity   Alcohol use: Yes   Drug use: Never   Sexual activity: Not on file  Other Topics Concern   Not on file  Social History Narrative   Not on file   Social Drivers of Health   Financial Resource Strain: Not on file  Food Insecurity: No Food Insecurity (01/28/2024)   Hunger Vital Sign    Worried About Running Out of Food in the Last Year: Never true    Ran Out of Food in the Last Year: Never true  Transportation Needs: No Transportation Needs (01/28/2024)   PRAPARE - Administrator, Civil Service (Medical): No    Lack of Transportation (Non-Medical): No  Physical Activity: Not on file  Stress: Not on file  Social Connections: Not on file    Hospital Course:   Patient was admitted on suicide precautions.  Risperidone was initiated to target psychosis.  He was started on 2 mg twice a day.  Patient was sedated at that dose.  We decreased his dose to 0.25 mg at bedtime which she tolerated better.  He did not exhibit any manic features or psychotic features during his hospital stay.  He did not exhibit any symptoms of alcohol withdrawal.  Sleep-wake cycle was well-regulated.  He participated with unit groups and therapeutic activities.  He did not require any psychiatric or medical emergency measures during his hospital  stay.  At interview today.  Patient feels far more clear minded with lower dose of risperidone.  He tells me that he slept well yesterday.  He is able to think clearly.  He is not experiencing racing thoughts.  He is not endorsing any delusions.  He is not experiencing any hallucinations.  No derealization or depersonalization.  He has remained in communication with his family.  States that they are very supportive.  He is not expressing any rageful thoughts towards himself or towards others.  He is processing ways of staying off alcohol though he  continues to minimize his use.  Social worker obtained collateral from his family.  They have the same opinion as his girlfriend that this was related to alcohol abuse.  No concerns about dangerousness.  Family will pick him up today.  Nursing staff reports that patient slept for 7.75 hours.  No observed response to internal stimuli.  No PRNs required.  He has been exhibiting appropriate behavior on the unit.  Patient and team agrees that he is back to his baseline.  Team agrees with discharge today in company of his family.   Physical Findings: AIMS:  , ,  ,  ,    CIWA:    COWS:     Musculoskeletal: Strength & Muscle Tone: within normal limits Gait & Station: normal Patient leans: N/A   Psychiatric Specialty Exam:  Presentation  General Appearance:  Casually dressed, not in any distress, good relatedness.  No EPS.   Eye Contact: Good.   Speech: Spontaneous.  Normal rate, tone and volume.  Normal prosody of speech.   Mood and Affect  Mood: Euthymic  Affect: Full range and mood congruent. Thought Process  Thought Processes: Linear and goal directed.   Descriptions of Associations: Intact.   Orientation:Full (Time, Place and Person)   Thought Content: No delusional thinking.  No suicidal thoughts.  No homicidal thoughts.  No thoughts of violence.  No negative ruminative thoughts.  No guilty ruminations.  No obsessions.   Hallucinations: No hallucination in any modality.   Sensorium  Memory: Good.  Judgment: Good.   Insight: Better insight into the role of alcohol in recent mental health issues.  Executive Functions  Concentration: Good.   Attention Span: Good.   Recall: Good   Fund of Knowledge: Good   Language: Good     Psychomotor Activity  Normal psychomotor activity   Physical Exam: Physical Exam ROS Blood pressure 134/77, pulse 97, temperature 98 F (36.7 C), temperature source Oral, resp. rate 12, height 5\' 5"  (1.651 m),  weight 60.1 kg, SpO2 100%. Body mass index is 22.07 kg/m.   Social History   Tobacco Use  Smoking Status Never  Smokeless Tobacco Never   Tobacco Cessation:  A prescription for an FDA-approved tobacco cessation medication provided at discharge   Blood Alcohol level:  Lab Results  Component Value Date   ETH 133 (H) 01/28/2024    Metabolic Disorder Labs:  Lab Results  Component Value Date   HGBA1C 4.9 01/29/2024   MPG 93.93 01/29/2024   No results found for: "PROLACTIN" Lab Results  Component Value Date   CHOL 168 01/29/2024   TRIG 136 01/29/2024   HDL 73 01/29/2024   CHOLHDL 2.3 01/29/2024   VLDL 27 01/29/2024   LDLCALC 68 01/29/2024    See Psychiatric Specialty Exam and Suicide Risk Assessment completed by Attending Physician prior to discharge.  Discharge destination:  Home  Is patient on multiple antipsychotic therapies at discharge:  No   Has Patient had three or more failed trials of antipsychotic monotherapy by history:  No  Recommended Plan for Multiple Antipsychotic Therapies: NA  Discharge Instructions     Diet - low sodium heart healthy   Complete by: As directed    Increase activity slowly   Complete by: As directed       Allergies as of 02/01/2024   No Known Allergies      Medication List     TAKE these medications      Indication  nicotine 21 mg/24hr patch Commonly known as: NICODERM CQ - dosed in mg/24 hours Place 1 patch (21 mg total) onto the skin daily. Start taking on: February 02, 2024  Indication: Nicotine Addiction   risperiDONE 0.25 MG tablet Commonly known as: RISPERDAL Take 1 tablet (0.25 mg total) by mouth at bedtime.  Indication: Brief psychotic episode.        Follow-up Information     [facility]. Go on 02/03/2024.   Specialty: Behavioral Health Why: Please go to this provider on 02/03/24 at 7:00 am for an assessment to obtain medication management services. You may also go on Monday  through Friday, arrive by 7:00 am. Contact information: [address] [place] [address] [phone]        [facility] Go on 02/04/2024.   Specialty: Professional Counselor Why: Please go to this provider on 02/04/24 at 9:00 am for an assessment to obtain therapy services. You may also go on Monday through Friday, between 9 am and 1 pm. Contact information: [address] [place] [address] [phone]                 Follow-up recommendations: Patient will stay on medication as recommended.  Patient will abstain from alcohol and all psychoactive substance.  Patient will follow up with appointments.  No dietary or activity restrictions.  Signed: Georgiann Cocker, MD 02/01/2024, 10:28 AM

## 2024-02-01 NOTE — Progress Notes (Signed)
   02/01/24 0900  Psych Admission Type (Psych Patients Only)  Admission Status Involuntary  Psychosocial Assessment  Patient Complaints None  Eye Contact Fair  Facial Expression Anxious  Affect Appropriate to circumstance  Speech Logical/coherent  Interaction Guarded  Motor Activity Other (Comment) (WDL)  Appearance/Hygiene Unremarkable  Behavior Characteristics Cooperative;Anxious  Mood Anxious;Pleasant  Thought Process  Coherency WDL  Content WDL  Delusions None reported or observed  Perception WDL  Hallucination None reported or observed  Judgment WDL  Confusion None  Danger to Self  Current suicidal ideation? Denies  Description of Suicide Plan NO PLAN  Self-Injurious Behavior No self-injurious ideation or behavior indicators observed or expressed   Agreement Not to Harm Self Yes  Description of Agreement VERBAL  Danger to Others  Danger to Others None reported or observed

## 2024-02-01 NOTE — Plan of Care (Signed)
   Problem: Education: Goal: Emotional status will improve Outcome: Progressing Goal: Mental status will improve Outcome: Progressing   Problem: Activity: Goal: Interest or engagement in activities will improve Outcome: Progressing Goal: Sleeping patterns will improve Outcome: Progressing   Problem: Safety: Goal: Periods of time without injury will increase Outcome: Progressing

## 2024-02-01 NOTE — BHH Suicide Risk Assessment (Signed)
 [facility] Discharge Suicide Risk Assessment   Principal Problem: Brief psychotic disorder [facility]) Discharge Diagnoses: Principal Problem:   Brief psychotic disorder (HCC) Active Problems:   Suicidal ideations   Alcohol intoxication (HCC)   Suicidal behavior   Violent behavior   Total Time spent with patient:   Musculoskeletal: Strength & Muscle Tone: within normal limits Gait & Station: normal Patient leans: N/A  Psychiatric Specialty Exam  Presentation  General Appearance:  Casually dressed, not in any distress, good relatedness.  No EPS.   Eye Contact: Good.   Speech: Spontaneous.  Normal rate, tone and volume.  Normal prosody of speech.   Mood and Affect  Mood: Euthymic  Affect: Full range and mood congruent. Thought Process  Thought Processes: Linear and goal directed.   Descriptions of Associations: Intact.   Orientation:Full (Time, Place and Person)   Thought Content: No delusional thinking.  No suicidal thoughts.  No homicidal thoughts.  No thoughts of violence.  No negative ruminative thoughts.  No guilty ruminations.  No obsessions.   Hallucinations: No hallucination in any modality.   Sensorium  Memory: Good.  Judgment: Good.   Insight: Better insight into the role of alcohol in recent mental health issues.  Executive Functions  Concentration: Good.   Attention Span: Good.   Recall: Good   Fund of Knowledge: Good   Language: Good     Psychomotor Activity  Normal psychomotor activity  Physical Exam: Physical Exam ROS Blood pressure 134/77, pulse 97, temperature 98 F (36.7 C), temperature source Oral, resp. rate 12, height 5\' 5"  (1.651 m), weight 60.1 kg, SpO2 100%. Body mass index is 22.07 kg/m.  Mental Status Per Nursing Assessment::   On Admission:  Suicidal ideation indicated by others (Per IVC papers-pt was loading a gun prior to admission)  Demographic Factors:  Male and Adolescent or young adult  Loss  Factors: Financial problems/change in socioeconomic status  Historical Factors: Family history of mental illness or substance abuse and Impulsivity  Risk Reduction Factors:   Sense of responsibility to family, Employed, Living with another person, especially a relative, Positive social support, Positive therapeutic relationship, and Positive coping skills or problem solving skills  Continued Clinical Symptoms:  Patient has completely come off the effects of alcohol.  He has tolerated recent reduction in dose of risperidone well.  He is more clear minded.  He is not endorsing any form of hallucination.  No delusional theme.  He is able to think clearly.  Not endorsing any psychiatric symptoms.  Cognitive Features That Contribute To Risk:  None    Suicide Risk:  Minimal: No identifiable suicidal ideation.  Patients presenting with no risk factors but with morbid ruminations; may be classified as minimal risk based on the severity of the depressive symptoms   Follow-up Information     [facility] Go on 02/03/2024.   Specialty: Behavioral Health Why: Please go to this provider on 02/03/24 at 7:00 am for an assessment to obtain medication management services. You may also go on Monday through Friday, arrive by 7:00 am. Contact information: [address] [place] [address] [phone]        [facility] [place] Go on 02/04/2024.   Specialty: Professional Counselor Why: Please go to this provider on 02/04/24 at 9:00 am for an assessment to obtain therapy services. You may also go on Monday through Friday, between 9 am and 1 pm. Contact information: [address] [place] [address] [phone]  Plan Of Care/Follow-up recommendations:  Patient will stay on medicine as recommended.  Patient will abstain from alcohol and any psychoactive substance use.  No dietary restrictions.  No restrictions  with respect to activity.  Georgiann Cocker, MD 02/01/2024, 10:22 AM

## 2024-02-01 NOTE — Progress Notes (Signed)
  [facility] Adult Case Management Discharge Plan :  Will you be returning to the same living situation after discharge:  Yes,  home w/ family At discharge, do you have transportation home?: Yes,  GF to provide at discharge Do you have the ability to pay for your medications: Yes,  [employer] benefits active  Release of information consent forms completed and in the chart;  Patient's signature needed at discharge.  Patient to Follow up at:  Follow-up Information     [facility]. Go on 02/03/2024.   Specialty: Behavioral Health Why: Please go to this provider on 02/03/24 at 7:00 am for an assessment to obtain medication management services. You may also go on Monday through Friday, arrive by 7:00 am. Contact information: [address] [place] [address] [phone]        [facility] Go on 02/04/2024.   Specialty: Professional Counselor Why: Please go to this provider on 02/04/24 at 9:00 am for an assessment to obtain therapy services. You may also go on Monday through Friday, between 9 am and 1 pm. Contact information: [address] [place] [address] [phone]                 Next level of care provider has access to [facility] Link:no  Safety Planning and Suicide Prevention discussed: Yes,  completed w/ mother Suzette Battiest on 01/31/24 and with Pt's girlfriend  Verlon Au  Has patient been referred to the Quitline?: Patient does not use tobacco/nicotine products  Patient has been referred for addiction treatment: Yes, referral information given but appointment not made [facility] (list facility).  Joelyn Oms Octavius Shin, LCSW 02/01/2024, 11:04 AM

## 2024-02-01 NOTE — Progress Notes (Signed)
 Patient discharged from [facility] on 02/01/24 at 1130. Patient denies SI, plan, and intention. Suicide safety plan completed, reviewed with this RN, given to the patient, and a copy in the chart. Patient denies HI/AVH upon discharge.  Patient is alert, oriented, and cooperative. RN provided patient with discharge paperwork and reviewed information with patient. Patient expressed that he understood all of the discharge instructions. Pt was satisfied with belongings returned to him from the locker and at bedside. Discharged patient to [facility] waiting room.

## 2024-02-01 NOTE — Plan of Care (Signed)
   Problem: Education: Goal: Emotional status will improve Outcome: Progressing Goal: Mental status will improve Outcome: Progressing   Problem: Activity: Goal: Interest or engagement in activities will improve Outcome: Progressing Goal: Sleeping patterns will improve Outcome: Progressing

## 2024-02-01 NOTE — Progress Notes (Signed)
   01/31/24 2300  Psych Admission Type (Psych Patients Only)  Admission Status Involuntary  Psychosocial Assessment  Patient Complaints None  Eye Contact Fair  Facial Expression Animated  Affect Appropriate to circumstance  Speech Logical/coherent  Interaction Guarded  Motor Activity Other (Comment) (wnl)  Appearance/Hygiene Unremarkable  Behavior Characteristics Cooperative;Appropriate to situation  Mood Pleasant  Thought Process  Coherency WDL  Content WDL  Delusions None reported or observed  Perception WDL  Hallucination None reported or observed  Judgment WDL  Confusion None  Danger to Self  Current suicidal ideation? Denies  Self-Injurious Behavior No self-injurious ideation or behavior indicators observed or expressed   Agreement Not to Harm Self Yes  Description of Agreement verbal  Danger to Others  Danger to Others None reported or observed

## 2024-10-09 ENCOUNTER — Ambulatory Visit
Admission: EM | Admit: 2024-10-09 | Discharge: 2024-10-09 | Disposition: A | Discharge status: Home / Self Care | Payer: Self-pay | Attending: Internal Medicine | Admitting: Internal Medicine

## 2024-10-09 ENCOUNTER — Encounter: Payer: Self-pay | Admitting: Emergency Medicine

## 2024-10-09 DIAGNOSIS — B379 Candidiasis, unspecified: Secondary | ICD-10-CM

## 2024-10-09 DIAGNOSIS — B3789 Other sites of candidiasis: Secondary | ICD-10-CM

## 2024-10-09 HISTORY — DX: Other specified health status: Z78.9

## 2024-10-09 MED ORDER — FLUCONAZOLE 150 MG PO TABS: 150.0000 mg | ORAL_TABLET | Freq: Every day | ORAL | 0 refills | Status: AC

## 2024-10-09 MED ORDER — NYSTATIN 100000 UNIT/GM EX CREA: TOPICAL_CREAM | CUTANEOUS | 0 refills | Status: AC

## 2024-10-09 NOTE — ED Triage Notes (Signed)
 Pt reports rash to both axillary areas and inguinal area since 09/22/2024. Irritation in these areas with raised bumps and surrounding redness. Pt reports itchiness to the areas. Unsure of causative agent - no new soaps, detergent, chemical exposures, etc. Has applied OTC rash cream to the areas with no improvement.

## 2024-10-09 NOTE — Discharge Instructions (Addendum)
 Symptoms and physical exam findings are most consistent with a fungal infection and the axillas and groin regions.  We can treat this with both a medication by mouth as well as a topical medication.  We recommend the following: Diflucan 150 mg take 1 tablet now and then repeat in 3 days.  Nystatin cream topically to the affected areas twice daily.  You may have to do this for about a week for resolution Make sure to wear loosefitting close that are moisture wicking. Wash the areas with a mild soap and water Return to urgent care or PCP if symptoms worsen or fail to resolve.

## 2024-10-09 NOTE — ED Provider Notes (Signed)
 [facility]    CSN: [id number] Arrival date & time: 10/09/24  1558      History   Chief Complaint Chief Complaint  Patient presents with   Rash    HPI Cory Wright is a 18 y.o. male.    Rash Associated symptoms: no abdominal pain, no fever, no joint pain, no shortness of breath, no sore throat and not vomiting     Past Medical History:  Diagnosis Date   Brief psychotic disorder (HCC)    No pertinent past medical history    Suicidal behavior    Vitamin D  deficiency     Patient Active Problem List   Diagnosis Date Noted   Brief psychotic disorder (HCC) 01/29/2024   Alcohol intoxication 01/29/2024   Suicidal behavior 01/29/2024   Violent behavior 01/29/2024   Suicidal ideations 01/28/2024    History reviewed. No pertinent surgical history.     Home Medications    Prior to Admission medications   Medication Sig Start Date End Date Taking? Authorizing Provider  fluconazole (DIFLUCAN) 150 MG tablet Take 1 tablet (150 mg total) by mouth daily for 2 days. take 1 tablet now and then repeat in 3 days 10/09/24 10/11/24 Yes Stephane Junkins, Almarie LABOR, PA-C  nystatin cream (MYCOSTATIN) Apply to affected area 2 times daily 10/09/24  Yes Woodruff Skirvin A, PA-C  nicotine  (NICODERM CQ  - DOSED IN MG/24 HOURS) 21 mg/24hr patch Place 1 patch (21 mg total) onto the skin daily. Patient not taking: Reported on 10/09/2024 02/02/24   Izediuno, Vincent A, MD  risperiDONE  (RISPERDAL ) 0.25 MG tablet Take 1 tablet (0.25 mg total) by mouth at bedtime. Patient not taking: Reported on 10/09/2024 02/01/24   Hinda Jerrell LABOR, MD    Family History Family History  Problem Relation Age of Onset   Healthy Mother    Healthy Father     Social History Social History   Tobacco Use   Smoking status: Former    Types: Cigarettes   Smokeless tobacco: Never  Vaping Use   Vaping status: Former   Substances: Nicotine , Flavoring  Substance Use Topics    Alcohol use: Yes   Drug use: Never     Allergies   Patient has no known allergies.   Review of Systems Review of Systems  Constitutional:  Negative for chills and fever.  HENT:  Negative for ear pain and sore throat.   Eyes:  Negative for pain and visual disturbance.  Respiratory:  Negative for cough and shortness of breath.   Cardiovascular:  Negative for chest pain and palpitations.  Gastrointestinal:  Negative for abdominal pain and vomiting.  Genitourinary:  Negative for dysuria and hematuria.  Musculoskeletal:  Negative for arthralgias and back pain.  Skin:  Positive for rash. Negative for color change.  Neurological:  Negative for seizures and syncope.  All other systems reviewed and are negative.    Physical Exam Triage Vital Signs ED Triage Vitals [10/09/24 1639]  Encounter Vitals Group     BP 116/73     Girls Systolic BP Percentile      Girls Diastolic BP Percentile      Boys Systolic BP Percentile      Boys Diastolic BP Percentile      Pulse Rate 61     Resp 16     Temp 97.9 F (36.6 C)     Temp Source Oral     SpO2 98 %     Weight      Height  Head Circumference      Peak Flow      Pain Score 0     Pain Loc      Pain Education      Exclude from Growth Chart    No data found.  Updated Vital Signs BP 116/73 (BP Location: Left Arm)   Pulse 61   Temp 97.9 F (36.6 C) (Oral)   Resp 16   SpO2 98%   Visual Acuity Right Eye Distance:   Left Eye Distance:   Bilateral Distance:    Right Eye Near:   Left Eye Near:    Bilateral Near:     Physical Exam Vitals and nursing note reviewed.  Constitutional:      General: He is not in acute distress.    Appearance: He is well-developed.  HENT:     Head: Normocephalic and atraumatic.  Eyes:     Conjunctiva/sclera: Conjunctivae normal.  Cardiovascular:     Rate and Rhythm: Normal rate and regular rhythm.  Pulmonary:     Effort: Pulmonary effort is normal. No respiratory distress.  Abdominal:      Tenderness: There is no abdominal tenderness.  Musculoskeletal:        General: No swelling.     Cervical back: Neck supple.  Skin:    General: Skin is warm and dry.     Capillary Refill: Capillary refill takes less than 2 seconds.     Comments: Both axillas and both groins with scaly slightly raised reddish rash with satellite areas.  Neurological:     Mental Status: He is alert.  Psychiatric:        Mood and Affect: Mood normal.      UC Treatments / Results  Labs (all labs ordered are listed, but only abnormal results are displayed) Labs Reviewed - No data to display  EKG   Radiology No results found.  Procedures Procedures (including critical care time)  Medications Ordered in UC Medications - No data to display  Initial Impression / Assessment and Plan / UC Course  I have reviewed the triage vital signs and the nursing notes.  Pertinent labs & imaging results that were available during my care of the patient were reviewed by me and considered in my medical decision making (see chart for details).     Candida rash of groin  Candidiasis   Symptoms and physical exam findings are most consistent with a fungal infection and the axillas and groin regions.  We can treat this with both a medication by mouth as well as a topical medication.  We recommend the following: Diflucan 150 mg take 1 tablet now and then repeat in 3 days.  Nystatin cream topically to the affected areas twice daily.  You may have to do this for about a week for resolution Make sure to wear loosefitting close that are moisture wicking. Wash the areas with a mild soap and water Return to urgent care or PCP if symptoms worsen or fail to resolve.    Final Clinical Impressions(s) / UC Diagnoses   Final diagnoses:  Candida rash of groin  Candidiasis     Discharge Instructions      Symptoms and physical exam findings are most consistent with a fungal infection and the axillas and groin  regions.  We can treat this with both a medication by mouth as well as a topical medication.  We recommend the following: Diflucan 150 mg take 1 tablet now and then repeat in 3 days.  Nystatin cream topically to the affected areas twice daily.  You may have to do this for about a week for resolution Make sure to wear loosefitting close that are moisture wicking. Wash the areas with a mild soap and water Return to urgent care or PCP if symptoms worsen or fail to resolve.      ED Prescriptions     Medication Sig Dispense Auth. Provider   fluconazole (DIFLUCAN) 150 MG tablet Take 1 tablet (150 mg total) by mouth daily for 2 days. take 1 tablet now and then repeat in 3 days 2 tablet Becci Batty A, PA-C   nystatin cream (MYCOSTATIN) Apply to affected area 2 times daily 30 g Teresa Almarie LABOR, PA-C      PDMP not reviewed this encounter.   Teresa Almarie LABOR, PA-C 10/09/24 1734
